# Patient Record
Sex: Male | Born: 2000 | Race: White | Hispanic: No | Marital: Single | Smoking: Never smoker
Health system: Southern US, Community
[De-identification: ages and names within clinical notes are randomized; demographics above are authoritative.]

---
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
## 2021-06-12 ENCOUNTER — Emergency Department
Admission: EM | Admit: 2021-06-12 | Discharge: 2021-06-12 | Disposition: A | Discharge status: Home / Self Care | Payer: BC Managed Care – PPO | Attending: Emergency Medicine | Admitting: Emergency Medicine

## 2021-06-12 ENCOUNTER — Other Ambulatory Visit: Payer: Self-pay

## 2021-06-12 DIAGNOSIS — Z20822 Contact with and (suspected) exposure to covid-19: Secondary | ICD-10-CM | POA: Diagnosis not present

## 2021-06-12 DIAGNOSIS — Y900 Blood alcohol level of less than 20 mg/100 ml: Secondary | ICD-10-CM | POA: Diagnosis not present

## 2021-06-12 DIAGNOSIS — F332 Major depressive disorder, recurrent severe without psychotic features: Secondary | ICD-10-CM | POA: Insufficient documentation

## 2021-06-12 DIAGNOSIS — F331 Major depressive disorder, recurrent, moderate: Secondary | ICD-10-CM

## 2021-06-12 DIAGNOSIS — F329 Major depressive disorder, single episode, unspecified: Secondary | ICD-10-CM | POA: Diagnosis present

## 2021-06-12 LAB — URINE DRUG SCREEN, QUALITATIVE (ARMC ONLY)
Amphetamines, Ur Screen: NOT DETECTED
Barbiturates, Ur Screen: NOT DETECTED
Benzodiazepine, Ur Scrn: NOT DETECTED
Cannabinoid 50 Ng, Ur ~~LOC~~: NOT DETECTED
Cocaine Metabolite,Ur ~~LOC~~: NOT DETECTED
MDMA (Ecstasy)Ur Screen: NOT DETECTED
Methadone Scn, Ur: NOT DETECTED
Opiate, Ur Screen: NOT DETECTED
Phencyclidine (PCP) Ur S: NOT DETECTED
Tricyclic, Ur Screen: NOT DETECTED

## 2021-06-12 LAB — CBC WITH DIFFERENTIAL/PLATELET
Abs Immature Granulocytes: 0.02 10*3/uL (ref 0.00–0.07)
Basophils Absolute: 0 10*3/uL (ref 0.0–0.1)
Basophils Relative: 0 %
Eosinophils Absolute: 0 10*3/uL (ref 0.0–0.5)
Eosinophils Relative: 0 %
HCT: 45.1 % (ref 39.0–52.0)
Hemoglobin: 15.2 g/dL (ref 13.0–17.0)
Immature Granulocytes: 0 %
Lymphocytes Relative: 18 %
Lymphs Abs: 1.6 10*3/uL (ref 0.7–4.0)
MCH: 29.6 pg (ref 26.0–34.0)
MCHC: 33.7 g/dL (ref 30.0–36.0)
MCV: 87.9 fL (ref 80.0–100.0)
Monocytes Absolute: 0.5 10*3/uL (ref 0.1–1.0)
Monocytes Relative: 6 %
Neutro Abs: 6.9 10*3/uL (ref 1.7–7.7)
Neutrophils Relative %: 76 %
Platelets: 201 10*3/uL (ref 150–400)
RBC: 5.13 MIL/uL (ref 4.22–5.81)
RDW: 12.5 % (ref 11.5–15.5)
WBC: 9.1 10*3/uL (ref 4.0–10.5)
nRBC: 0 % (ref 0.0–0.2)

## 2021-06-12 LAB — RESP PANEL BY RT-PCR (FLU A&B, COVID) ARPGX2
Influenza A by PCR: NEGATIVE
Influenza B by PCR: NEGATIVE
SARS Coronavirus 2 by RT PCR: NEGATIVE

## 2021-06-12 LAB — COMPREHENSIVE METABOLIC PANEL
ALT: 14 U/L (ref 0–44)
AST: 30 U/L (ref 15–41)
Albumin: 4.7 g/dL (ref 3.5–5.0)
Alkaline Phosphatase: 59 U/L (ref 38–126)
Anion gap: 8 (ref 5–15)
BUN: 12 mg/dL (ref 6–20)
CO2: 26 mmol/L (ref 22–32)
Calcium: 9.2 mg/dL (ref 8.9–10.3)
Chloride: 105 mmol/L (ref 98–111)
Creatinine, Ser: 0.87 mg/dL (ref 0.61–1.24)
GFR, Estimated: >60 mL/min (ref >60)
Glucose, Bld: 126 mg/dL — ABNORMAL HIGH (ref 70–99)
Potassium: 3.6 mmol/L (ref 3.5–5.1)
Sodium: 139 mmol/L (ref 135–145)
Total Bilirubin: 1.3 mg/dL — ABNORMAL HIGH (ref 0.3–1.2)
Total Protein: 7.5 g/dL (ref 6.5–8.1)

## 2021-06-12 LAB — ETHANOL: Alcohol, Ethyl (B): <10 mg/dL (ref <10)

## 2021-06-12 MED ORDER — MELATONIN 5 MG PO TABS: 5.0000 mg | ORAL_TABLET | Freq: Every day | ORAL | Status: DC

## 2021-06-12 NOTE — ED Notes (Signed)
Patient speaking with TTS.  

## 2021-06-12 NOTE — ED Notes (Signed)
Patient reports that he has been feeling depressed and that things just keep piling on him.  States he want to spread his wings but feels like he is being held back.  Patient denies SI/HI at this time.

## 2021-06-12 NOTE — ED Triage Notes (Signed)
Pt comes pov with mom for psych eval. States he is not "100% mentally there" and states depression. Denies SI/HI/AVH. States "feeling trapped".

## 2021-06-12 NOTE — ED Provider Notes (Addendum)
[facility] Emergency Department Provider Note  ____________________________________________   Event Date/Time   First MD Initiated Contact with Patient 06/12/21 1927     (approximate)  I have reviewed the triage vital signs and the nursing notes.   HISTORY  Chief Complaint Psychiatric Evaluation    HPI Michael Austin is a 20 y.o. male here with reported worsening depression.  Patient has unfortunately been through significant stress at home.  His father had a colon surgery that unfortunately did not go well and he has been very chronically ill for the last year.  Patient has also been having difficulty as he has been socially isolated, does not drive, and has had worsening depression.  He has had some transient thoughts of self-harm months ago but denies any current plans.  He was not responding to his friends earlier today so they became concerned for him.  They did a wellness check on him, and patient denied any SI so he was going to wait, but once his mother found out, she called and brought him to the ED.  Patient denies any current suicidal or homicidal ideation.  He does note dysphoric mood and states that he has had difficulty coping with his father's illness.  He states that his transient SI is resolved, and states his friends and family as supportive factors that limited him from acting on this.  He has not have access to a weapon.    History reviewed. No pertinent past medical history.  There are no problems to display for this patient.   History reviewed. No pertinent surgical history.  Prior to Admission medications   Not on File    Allergies Augmentin [amoxicillin-pot clavulanate]  History reviewed. No pertinent family history.  Social History Social History   Tobacco Use   Smoking status: Never   Smokeless tobacco: Never  Vaping Use   Vaping Use: Never used  Substance Use Topics   Alcohol use: Not Currently   Drug use: Not  Currently    Review of Systems  Review of Systems  Constitutional:  Negative for chills and fever.  HENT:  Negative for sore throat.   Respiratory:  Negative for shortness of breath.   Cardiovascular:  Negative for chest pain.  Gastrointestinal:  Negative for abdominal pain.  Genitourinary:  Negative for flank pain.  Musculoskeletal:  Negative for neck pain.  Skin:  Negative for rash and wound.  Allergic/Immunologic: Negative for immunocompromised state.  Neurological:  Negative for weakness and numbness.  Hematological:  Does not bruise/bleed easily.  Psychiatric/Behavioral:  Positive for dysphoric mood.     ____________________________________________  PHYSICAL EXAM:      VITAL SIGNS: ED Triage Vitals  Enc Vitals Group     BP 06/12/21 2107 114/72     Pulse Rate 06/12/21 2107 87     Resp 06/12/21 2107 17     Temp --      Temp src --      SpO2 06/12/21 2107 99 %     Weight 06/12/21 1843 149 lb (67.6 kg)     Height 06/12/21 1843 6\' 1"  (1.854 m)     Head Circumference --      Peak Flow --      Pain Score 06/12/21 1843 0     Pain Loc --      Pain Edu? --      Excl. in GC? --      Physical Exam Vitals and nursing note reviewed.  Constitutional:  General: He is not in acute distress.    Appearance: He is well-developed.  HENT:     Head: Normocephalic and atraumatic.  Eyes:     Conjunctiva/sclera: Conjunctivae normal.  Cardiovascular:     Rate and Rhythm: Normal rate and regular rhythm.     Heart sounds: Normal heart sounds.  Pulmonary:     Effort: Pulmonary effort is normal. No respiratory distress.     Breath sounds: No wheezing.  Abdominal:     General: There is no distension.  Musculoskeletal:     Cervical back: Neck supple.  Skin:    General: Skin is warm.     Capillary Refill: Capillary refill takes less than 2 seconds.     Findings: No rash.  Neurological:     Mental Status: He is alert and oriented to person, place, and time.     Motor: No  abnormal muscle tone.      ____________________________________________   LABS (all labs ordered are listed, but only abnormal results are displayed)  Labs Reviewed  COMPREHENSIVE METABOLIC PANEL - Abnormal; Notable for the following components:      Result Value   Glucose, Bld 126 (*)    Total Bilirubin 1.3 (*)    All other components within normal limits  RESP PANEL BY RT-PCR (FLU A&B, COVID) ARPGX2  ETHANOL  URINE DRUG SCREEN, QUALITATIVE ([facility] ONLY)  CBC WITH DIFFERENTIAL/PLATELET    ____________________________________________  EKG:  ________________________________________  RADIOLOGY All imaging, including plain films, CT scans, and ultrasounds, independently reviewed by me, and interpretations confirmed via formal radiology reads.  ED MD interpretation:     Official radiology report(s): No results found.  ____________________________________________  PROCEDURES   Procedure(s) performed (including Critical Care):  Procedures  ____________________________________________  INITIAL IMPRESSION / MDM / ASSESSMENT AND PLAN / ED COURSE  As part of my medical decision making, I reviewed the following data within the electronic MEDICAL RECORD NUMBER Nursing notes reviewed and incorporated, Old chart reviewed, Notes from prior ED visits, and [place] Controlled Substance Database       *Michael Austin was evaluated in Emergency Department on 07/01/2021 for the symptoms described in the history of present illness. He was evaluated in the context of the global COVID-19 pandemic, which necessitated consideration that the patient might be at risk for infection with the SARS-CoV-2 virus that causes COVID-19. Institutional protocols and algorithms that pertain to the evaluation of patients at risk for COVID-19 are in a state of rapid change based on information released by regulatory bodies including the CDC and federal and state organizations. These policies and algorithms were  followed during the patient's care in the ED.  Some ED evaluations and interventions may be delayed as a result of limited staffing during the pandemic.*     Medical Decision Making:  20 yo M here seeking psychiatric evaluation. Pt has no formal diagnosis but exam, history is most c/w underlying MDD, exacerbated by recent family stressors. No apparent organic etiology - CBC, CMP unremarkable. He is not clinically intoxicated and denies substance abuse. Pt initially requesting to speak to a psychiatrist. However, due to prolonged ED time waiting for consult, he and mother have requested to d/c with f/u as outpt. Pt has already scheduled f/u with [facility], and is calm, goal-oriented, and appropriate here. He does endorse prior SI but denies any currently, has protective factors w/ mother and friends, and has agreed to safety plan along with mother, who is comfortable helping pt at home. Do not feel  he meets IVC criteria. Will provide him with resources and encourage him to return should he develop any SI, HI, AVH.  ____________________________________________  FINAL CLINICAL IMPRESSION(S) / ED DIAGNOSES  Final diagnoses:  Moderate episode of recurrent major depressive disorder (HCC)     MEDICATIONS GIVEN DURING THIS VISIT:  Medications - No data to display    ED Discharge Orders     None        Note:  This document was prepared using Dragon voice recognition software and may include unintentional dictation errors.   Shaune Pollack, MD 06/13/21 Regino Bellow    Shaune Pollack, MD 07/01/21 [phone]

## 2021-06-12 NOTE — BH Assessment (Signed)
Comprehensive Clinical Assessment (CCA) Note  06/12/2021 Michael Austin [id number]  Chief Complaint: Patient is a 20 year old male presenting to [facility] ED voluntarily with his mother for depression. Per triage note Pt comes pov with mom for psych eval. States he is not "100% mentally there" and states depression. Denies SI/HI/AVH. States "feeling trapped". During assessment patient appears alert and oriented x4, cooperative and anxious. Patient reports why he is presenting with his mother "just a lot of things being boiled over, my friends did a wellness check on me, I've been feeling tired of living with my parents and someone from [facility] came today and told me to come here voluntarily to be evaluated." "My dad had a major surgery 2 years ago and he nearly died and since then he constantly yells and he is more irritable, I feel like I'm constantly on eggshells, I've been feeling down for 2 months." Patient reports that he has been isolating himself and hasn't been doing things he enjoys "like playing the game with my friends online or talking to my friends online." Patient reports that he has been engaging in "Masochism, it's a form of sexual pleasure like hitting myself with a belt or choking myself with a belt." Patient reports that this not a form of self-harm "it's more sexual pleasure." Patient reports that he does not currently feel suicidal but reports having thoughts "about a month ago I wanted to take pills but I stopped myself." Patient reports that he is currently looking to engage in therapy but has not found one yet. Patient does report that he plans on moving in with his friends to get away from his parents house and the stress he feels living with him. Patient denies SI/HI/AH/VH  Patient's mother Michael Austin reports that she does not feel like the patient will hurt himself and does acknowledge that the patient has been feeling depressed and overwhelmed while living with him.   Pending psyc  consult Chief Complaint  Patient presents with   Psychiatric Evaluation   Visit Diagnosis: Major Depressive Disorder    CCA Screening, Triage and Referral (STR)  Patient Reported Information How did you hear about [place]? Family/Friend  Referral name: No data recorded Referral phone number: No data recorded  Whom do you see for routine medical problems? No data recorded Practice/Facility Name: No data recorded Practice/Facility Phone Number: No data recorded Name of Contact: No data recorded Contact Number: No data recorded Contact Fax Number: No data recorded Prescriber Name: No data recorded Prescriber Address (if known): No data recorded  What Is the Reason for Your Visit/Call Today? Patient presents voluntarily with his mother for depression  How Long Has This Been Causing You Problems? 1-6 months  What Do You Feel Would Help You the Most Today? No data recorded  Have You Recently Been in Any Inpatient Treatment (Hospital/Detox/Crisis Center/28-Day Program)? No data recorded Name/Location of Program/Hospital:No data recorded How Long Were You There? No data recorded When Were You Discharged? No data recorded  Have You Ever Received Services From [facility] Before? No data recorded Who Do You See at [facility]? No data recorded  Have You Recently Had Any Thoughts About Hurting Yourself? No  Are You Planning to Commit Suicide/Harm Yourself At This time? No   Have you Recently Had Thoughts About Hurting Someone Karolee Ohs? No  Explanation: No data recorded  Have You Used Any Alcohol or Drugs in the Past 24 Hours? No  How Long Ago Did You Use Drugs or  Alcohol? No data recorded What Did You Use and How Much? No data recorded  Do You Currently Have a Therapist/Psychiatrist? No  Name of Therapist/Psychiatrist: No data recorded  Have You Been Recently Discharged From Any Office Practice or Programs? No  Explanation of Discharge From Practice/Program: No data  recorded    CCA Screening Triage Referral Assessment Type of Contact: Face-to-Face  Is this Initial or Reassessment? No data recorded Date Telepsych consult ordered in [facility]:  No data recorded Time Telepsych consult ordered in [facility]:  No data recorded  Patient Reported Information Reviewed? No data recorded Patient Left Without Being Seen? No data recorded Reason for Not Completing Assessment: No data recorded  Collateral Involvement: No data recorded  Does Patient Have a Court Appointed Legal Guardian? No data recorded Name and Contact of Legal Guardian: No data recorded If Minor and Not Living with Parent(s), Who has Custody? No data recorded Is CPS involved or ever been involved? Never  Is APS involved or ever been involved? Never   Patient Determined To Be At Risk for Harm To Self or Others Based on Review of Patient Reported Information or Presenting Complaint? No  Method: No data recorded Availability of Means: No data recorded Intent: No data recorded Notification Required: No data recorded Additional Information for Danger to Others Potential: No data recorded Additional Comments for Danger to Others Potential: No data recorded Are There Guns or Other Weapons in Your Home? No data recorded Types of Guns/Weapons: No data recorded Are These Weapons Safely Secured?                            No data recorded Who Could Verify You Are Able To Have These Secured: No data recorded Do You Have any Outstanding Charges, Pending Court Dates, Parole/Probation? No data recorded Contacted To Inform of Risk of Harm To Self or Others: No data recorded  Location of Assessment: [facility] ED   Does Patient Present under Involuntary Commitment? No  IVC Papers Initial File Date: No data recorded  [place] of Residence: [place]   Patient Currently Receiving the Following Services: No data recorded  Determination of Need: Emergent (2 hours)   Options For Referral: No data  recorded    CCA Biopsychosocial Intake/Chief Complaint:  No data recorded Current Symptoms/Problems: No data recorded  Patient Reported Schizophrenia/Schizoaffective Diagnosis in Past: No   Strengths: Patient is able to communicate  Preferences: No data recorded Abilities: No data recorded  Type of Services Patient Feels are Needed: No data recorded  Initial Clinical Notes/Concerns: No data recorded  Mental Health Symptoms Depression:   Change in energy/activity; Difficulty Concentrating   Duration of Depressive symptoms:  Greater than two weeks   Mania:   None   Anxiety:    Difficulty concentrating; Tension; Worrying   Psychosis:   None   Duration of Psychotic symptoms: No data recorded  Trauma:   None   Obsessions:   None   Compulsions:   None   Inattention:   None   Hyperactivity/Impulsivity:   None   Oppositional/Defiant Behaviors:   None   Emotional Irregularity:   None   Other Mood/Personality Symptoms:  No data recorded   Mental Status Exam Appearance and self-care  Stature:   Average   Weight:   Average weight   Clothing:   Casual   Grooming:   Normal   Cosmetic use:   None   Posture/gait:   Normal  Motor activity:   Not Remarkable   Sensorium  Attention:   Normal   Concentration:   Normal   Orientation:   X5   Recall/memory:   Normal   Affect and Mood  Affect:   Appropriate   Mood:   Depressed   Relating  Eye contact:   Normal   Facial expression:   Responsive   Attitude toward examiner:   Cooperative   Thought and Language  Speech flow:  Clear and Coherent   Thought content:   Appropriate to Mood and Circumstances   Preoccupation:   None   Hallucinations:   None   Organization:  No data recorded  [employer] of Knowledge:   Fair   Intelligence:   Average   Abstraction:   Normal   Judgement:   Good   Reality Testing:   Realistic   Insight:   Good    Decision Making:   Normal   Social Functioning  Social Maturity:   Isolates   Social Judgement:   Normal   Stress  Stressors:   Family conflict   Coping Ability:   Normal   Skill Deficits:   None   Supports:   Family; Friends/Service system     Religion: Religion/Spirituality Are You A Religious Person?: No  Leisure/Recreation: Leisure / Recreation Do You Have Hobbies?: No  Exercise/Diet: Exercise/Diet Do You Exercise?: No Have You Gained or Lost A Significant Amount of Weight in the Past Six Months?: No Do You Follow a Special Diet?: No Do You Have Any Trouble Sleeping?: No   CCA Employment/Education Employment/Work Situation: Employment / Work Situation Employment Situation: Unemployed Patient's Job has Been Impacted by Current Illness: No Has Patient ever Been in Equities trader?: No  Education: Education Is Patient Currently Attending School?: Yes School Currently Attending: Design[employer], multimedia Did Theme park manager?: Yes What Type of College Degree Do you Have?: Currently in college Did You Have An Individualized Education Program (IIEP): No Did You Have Any Difficulty At [employer]?: No Patient's Education Has Been Impacted by Current Illness: No   CCA Family/Childhood History Family and Relationship History: Family history Marital status: Single Does patient have children?: No  Childhood History:  Childhood History By whom was/is the patient raised?: Both parents Did patient suffer any verbal/emotional/physical/sexual abuse as a child?: No Did patient suffer from severe childhood neglect?: No Has patient ever been sexually abused/assaulted/raped as an adolescent or adult?: No Was the patient ever a victim of a crime or a disaster?: No Witnessed domestic violence?: No Has patient been affected by domestic violence as an adult?: No  Child/Adolescent Assessment:     CCA Substance Use Alcohol/Drug Use: Alcohol / Drug Use Pain  Medications: See MAR Prescriptions: See MAR Over the Counter: See MAR History of alcohol / drug use?: No history of alcohol / drug abuse                         ASAM's:  Six Dimensions of Multidimensional Assessment  Dimension 1:  Acute Intoxication and/or Withdrawal Potential:      Dimension 2:  Biomedical Conditions and Complications:      Dimension 3:  Emotional, Behavioral, or Cognitive Conditions and Complications:     Dimension 4:  Readiness to Change:     Dimension 5:  Relapse, Continued use, or Continued Problem Potential:     Dimension 6:  Recovery/Living Environment:     ASAM Severity Score:  ASAM Recommended Level of Treatment:     Substance use Disorder (SUD)    Recommendations for Services/Supports/Treatments:  Pending psyc consult  DSM5 Diagnoses: There are no problems to display for this patient.   Patient Centered Plan: Patient is on the following Treatment Plan(s):  Depression   Referrals to Alternative Service(s): Referred to Alternative Service(s):   Place:   Date:   Time:    Referred to Alternative Service(s):   Place:   Date:   Time:    Referred to Alternative Service(s):   Place:   Date:   Time:    Referred to Alternative Service(s):   Place:   Date:   Time:     Dimitrius Steedman A Rudi Bunyard, LCAS-A
